# Patient Record
Sex: Male | Born: 1935 | Race: White | Hispanic: No | Smoking: Never smoker
Health system: Southern US, Community
[De-identification: ages and names within clinical notes are randomized; demographics above are authoritative.]

## PROBLEM LIST (undated history)

## (undated) DIAGNOSIS — I499 Cardiac arrhythmia, unspecified: Secondary | ICD-10-CM

## (undated) DIAGNOSIS — E079 Disorder of thyroid, unspecified: Secondary | ICD-10-CM

## (undated) HISTORY — PX: MELANOMA EXCISION: SHX5266

---

## 2011-08-08 ENCOUNTER — Encounter (HOSPITAL_BASED_OUTPATIENT_CLINIC_OR_DEPARTMENT_OTHER): Payer: Self-pay

## 2011-08-08 ENCOUNTER — Emergency Department (HOSPITAL_BASED_OUTPATIENT_CLINIC_OR_DEPARTMENT_OTHER)
Admission: EM | Admit: 2011-08-08 | Discharge: 2011-08-08 | Disposition: A | Discharge status: Home / Self Care | Payer: Medicare Other | Attending: Emergency Medicine | Admitting: Emergency Medicine

## 2011-08-08 ENCOUNTER — Emergency Department (INDEPENDENT_AMBULATORY_CARE_PROVIDER_SITE_OTHER): Payer: Medicare Other

## 2011-08-08 DIAGNOSIS — M25529 Pain in unspecified elbow: Secondary | ICD-10-CM | POA: Insufficient documentation

## 2011-08-08 DIAGNOSIS — S46909A Unspecified injury of unspecified muscle, fascia and tendon at shoulder and upper arm level, unspecified arm, initial encounter: Secondary | ICD-10-CM | POA: Insufficient documentation

## 2011-08-08 DIAGNOSIS — M25539 Pain in unspecified wrist: Secondary | ICD-10-CM | POA: Insufficient documentation

## 2011-08-08 DIAGNOSIS — M25519 Pain in unspecified shoulder: Secondary | ICD-10-CM | POA: Insufficient documentation

## 2011-08-08 DIAGNOSIS — S4990XA Unspecified injury of shoulder and upper arm, unspecified arm, initial encounter: Secondary | ICD-10-CM

## 2011-08-08 DIAGNOSIS — W19XXXA Unspecified fall, initial encounter: Secondary | ICD-10-CM

## 2011-08-08 DIAGNOSIS — Z79899 Other long term (current) drug therapy: Secondary | ICD-10-CM | POA: Insufficient documentation

## 2011-08-08 DIAGNOSIS — S4980XA Other specified injuries of shoulder and upper arm, unspecified arm, initial encounter: Secondary | ICD-10-CM | POA: Insufficient documentation

## 2011-08-08 DIAGNOSIS — W010XXA Fall on same level from slipping, tripping and stumbling without subsequent striking against object, initial encounter: Secondary | ICD-10-CM | POA: Insufficient documentation

## 2011-08-08 DIAGNOSIS — IMO0002 Reserved for concepts with insufficient information to code with codable children: Secondary | ICD-10-CM | POA: Insufficient documentation

## 2011-08-08 MED ORDER — LIDOCAINE-EPINEPHRINE-TETRACAINE (LET) SOLUTION
NASAL | Status: AC
  Administered 2011-08-08: 3 mL
  Filled 2011-08-08: qty 3

## 2011-08-08 MED ORDER — HYDROCODONE-ACETAMINOPHEN 5-325 MG PO TABS
ORAL_TABLET | ORAL | Status: AC
  Administered 2011-08-08: 1
  Filled 2011-08-08: qty 1

## 2011-08-08 MED ORDER — LIDOCAINE HCL 2 % IJ SOLN
INTRAMUSCULAR | Status: AC
  Filled 2011-08-08: qty 1

## 2011-08-08 MED ORDER — HYDROCODONE-ACETAMINOPHEN 5-325 MG PO TABS
1.0000 | ORAL_TABLET | ORAL | Status: AC | PRN
Start: 2011-08-08 — End: 2011-08-18

## 2011-08-08 MED ORDER — ACETAMINOPHEN 325 MG PO TABS
650.0000 mg | ORAL_TABLET | Freq: Once | ORAL | Status: AC
  Administered 2011-08-08: 650 mg via ORAL
  Filled 2011-08-08: qty 2

## 2011-08-08 NOTE — ED Notes (Signed)
LET applied to left elbow wound

## 2011-08-08 NOTE — ED Provider Notes (Signed)
Medical screening examination/treatment/procedure(s) were conducted as a shared visit with non-physician practitioner(s) and myself.  I personally evaluated the patient during the encounter  Pt with fall, plain films shows no fracture.  Skin avulsion without suturable wound to palm, not bleeding, and skin tear to left elbow, but FROM of elbow, wrist, hand.  Will tack skin back and place steri strips.    VS stable.  ROM of shoulder is limited, but present.  Likely contused and sprained.    Gavin Pound. Davis Vannatter, MD 08/08/11 1652

## 2011-08-08 NOTE — ED Notes (Signed)
Pt fell today on his carport and landed on concrete.  C/o L shoulder pain, (ROM decr d/t pain), abrasions to L wrist and L elbow.  Bleeding controlled by bandages applied at home.

## 2011-08-08 NOTE — ED Provider Notes (Signed)
History     CSN: [id number]  Arrival date & time 08/08/11  1457   First MD Initiated Contact with Patient 08/08/11 1533      Chief Complaint  Patient presents with  . Fall    (Consider location/radiation/quality/duration/timing/severity/associated sxs/prior treatment) HPI  Pt was walking in [place] when he accidentally tripped and fell. He did not hit his head, injure his neck, or have LOC. He is complaining of left shoulder pain, left elbow pain and left wrist pain. Pt denies being on blood thinners. He has a skin hear on his elbow and wrist. Bleeding is controlled at this time.  History reviewed. No pertinent past medical history.  Past Surgical History  Procedure Date  . Melanoma excision     History reviewed. No pertinent family history.  History  Substance Use Topics  . Smoking status: Never Smoker   . Smokeless tobacco: Never Used  . Alcohol Use: No      Review of Systems  All other systems reviewed and are negative.    Allergies  Review of patient's allergies indicates no known allergies.  Home Medications   Current Outpatient Rx  Name Route Sig Dispense Refill  . LEVOTHYROXINE SODIUM 50 MCG PO TABS Oral Take 50 mcg by mouth daily.    Marland Kitchen METOPROLOL TARTRATE 25 MG PO TABS Oral Take 25 mg by mouth daily.    Marland Kitchen NAPROXEN SODIUM 220 MG PO TABS Oral Take 220 mg by mouth 2 (two) times daily with a meal. Patient used this medication for the injury to his elbow.      Pulse 69  Temp(Src) 98 F (36.7 C) (Oral)  Ht 6\' 1"  (1.854 m)  Wt 186 lb (84.369 kg)  BMI 24.54 kg/m2  SpO2 96%  Physical Exam  Nursing note and vitals reviewed. Constitutional: He appears well-developed and well-nourished. No distress.  HENT:  Head: Normocephalic and atraumatic. Head is without Battle's sign, without abrasion, without contusion and without laceration.  Eyes: Pupils are equal, round, and reactive to light.  Neck: Normal range of motion. Neck supple.  Cardiovascular: Normal  rate and regular rhythm.   Pulmonary/Chest: Effort normal.  Abdominal: Soft.  Musculoskeletal:       Left shoulder: He exhibits decreased range of motion (due to pain), tenderness (mild tenderness to palpation) and pain. He exhibits no bony tenderness, no swelling, no effusion, no crepitus, no deformity, no laceration, no spasm, normal pulse and normal strength.       Left elbow: He exhibits normal range of motion, no swelling, no effusion and no deformity. Lacerations: abrasion.       Left wrist: He exhibits normal range of motion, no tenderness, no bony tenderness, no swelling, no effusion, no crepitus and no deformity. Lacerations: abrasion.       Cervical back: Normal.       Arms: Neurological: He is alert.  Skin: Skin is warm and dry.    ED Course  Procedures (including critical care time)  Labs Reviewed - No data to display Dg Shoulder Left  08/08/2011  *RADIOLOGY REPORT*  Clinical Data: Larey Seat with pain in the left shoulder  LEFT SHOULDER - 2+ VIEW  Comparison: None.  Findings: There is a downward sloping acromion with acromial spurring compromising the subacromial space most likely representing impingement and possible chronic rotator cuff disease. There is also degenerative change at the left [facility] joint.  However, no acute fracture or dislocation is seen.  IMPRESSION: Degenerative change most consistent with impingement or chronic rotator  cuff disease.  No acute abnormality.  Original Report Authenticated By: Juline Patch, M.D.     1. Fall   2. Shoulder injury       MDM    .wounds cleaned with betadine, steristripped and covered in tagederm. Wound care instructions given. Dr. Oletta Lamas has seen and evaluated patient and agrees with my treatment and plan.  Pt is ready to be DC'd at this time.  Pt has been advised of the symptoms that warrant their return to the ED. Patient has voiced understanding and has agreed to follow-up with the PCP or  specialist.             Dorthula Matas, PA 08/08/11 1646

## 2012-08-11 ENCOUNTER — Encounter (HOSPITAL_BASED_OUTPATIENT_CLINIC_OR_DEPARTMENT_OTHER): Payer: Self-pay

## 2012-08-11 ENCOUNTER — Emergency Department (HOSPITAL_BASED_OUTPATIENT_CLINIC_OR_DEPARTMENT_OTHER)
Admission: EM | Admit: 2012-08-11 | Discharge: 2012-08-11 | Disposition: A | Discharge status: Home / Self Care | Payer: Medicare Other | Attending: Emergency Medicine | Admitting: Emergency Medicine

## 2012-08-11 DIAGNOSIS — W2203XA Walked into furniture, initial encounter: Secondary | ICD-10-CM | POA: Insufficient documentation

## 2012-08-11 DIAGNOSIS — Z79899 Other long term (current) drug therapy: Secondary | ICD-10-CM | POA: Insufficient documentation

## 2012-08-11 DIAGNOSIS — Y9389 Activity, other specified: Secondary | ICD-10-CM | POA: Insufficient documentation

## 2012-08-11 DIAGNOSIS — Y929 Unspecified place or not applicable: Secondary | ICD-10-CM | POA: Insufficient documentation

## 2012-08-11 DIAGNOSIS — S51009A Unspecified open wound of unspecified elbow, initial encounter: Secondary | ICD-10-CM | POA: Insufficient documentation

## 2012-08-11 DIAGNOSIS — S51012A Laceration without foreign body of left elbow, initial encounter: Secondary | ICD-10-CM

## 2012-08-11 NOTE — ED Provider Notes (Signed)
History    This chart was scribed for Charles B. Bernette Mayers, MD scribed by Magnus Sinning. The patient was seen in room MH04/MH04 at 15:42   CSN: [id number]  Arrival date & time 08/11/12  1421      Chief Complaint  Patient presents with  . Elbow Injury    (Consider location/radiation/quality/duration/timing/severity/associated sxs/prior treatment) The history is provided by the patient. No language interpreter was used.   Manuel Rojas is a 79 y.o. male who presents to the Emergency Department complaining of a 3 cm skin tear to the left lateral elbow as a result of a injury from hitting his elbow along a door frame this afternoon. He denies any head injury,or any sensation of pain to the bone, and reports his tetanus is UTD.   History reviewed. No pertinent past medical history.  Past Surgical History  Procedure Laterality Date  . Melanoma excision      History reviewed. No pertinent family history.  History  Substance Use Topics  . Smoking status: Never Smoker   . Smokeless tobacco: Never Used  . Alcohol Use: No     10 Systems reviewed and are negative for acute change except as noted in the HPI. Review of Systems  Skin:       Laceration to left elbow    Allergies  Review of patient's allergies indicates no known allergies.  Home Medications   Current Outpatient Rx  Name  Route  Sig  Dispense  Refill  . levothyroxine (SYNTHROID, LEVOTHROID) 50 MCG tablet   Oral   Take 50 mcg by mouth daily.         . metoprolol tartrate (LOPRESSOR) 25 MG tablet   Oral   Take 25 mg by mouth daily.         . naproxen sodium (ANAPROX) 220 MG tablet   Oral   Take 220 mg by mouth 2 (two) times daily with a meal. Patient used this medication for the injury to his elbow.           BP 151/91  Pulse 78  Temp(Src) 98.5 F (36.9 C) (Oral)  Resp 20  Ht 6\' 2"  (1.88 m)  Wt 185 lb (83.915 kg)  BMI 23.74 kg/m2  SpO2 95%  Physical Exam  Nursing note and vitals  reviewed. Constitutional: He is oriented to person, place, and time. He appears well-developed and well-nourished.  HENT:  Head: Normocephalic and atraumatic.  Eyes: EOM are normal. Pupils are equal, round, and reactive to light.  Neck: Normal range of motion. Neck supple.  Cardiovascular: Normal rate, normal heart sounds and intact distal pulses.   Pulmonary/Chest: Effort normal and breath sounds normal.  Abdominal: Bowel sounds are normal. He exhibits no distension. There is no tenderness.  Musculoskeletal: Normal range of motion. He exhibits no edema and no tenderness.  Neurological: He is alert and oriented to person, place, and time. He has normal strength. No cranial nerve deficit or sensory deficit.  Skin: Skin is warm and dry. No rash noted.  3 cm skin tear to the left lateral elbow    Psychiatric: He has a normal mood and affect. His behavior is normal.    ED Course  Procedures (including critical care time) DIAGNOSTIC STUDIES: Oxygen Saturation is 95% on room air, adequate by my interpretation.    COORDINATION OF CARE: 15:43: Physical exam performed. Labs Reviewed - No data to display No results found.   1. Skin tear of elbow without complication, left, initial encounter  MDM  Pt with skin tear on the elbow, no concern for underlying bony injury. Skin tear dressed with steri-strips by EMT. Tetanus is UTD.   I personally performed the services described in this documentation, which was scribed in my presence. The recorded information has been reviewed and is accurate.           Charles B. Bernette Mayers, MD 08/11/12 [phone]

## 2012-08-11 NOTE — ED Notes (Signed)
Pt states that he hit his elbow on a door frame.  Skin tear to L elbow, presents for wound care.

## 2013-08-08 ENCOUNTER — Emergency Department (HOSPITAL_BASED_OUTPATIENT_CLINIC_OR_DEPARTMENT_OTHER)
Admission: EM | Admit: 2013-08-08 | Discharge: 2013-08-08 | Disposition: A | Discharge status: Other Acute Inpatient Hospital | Payer: Medicare Other | Attending: Emergency Medicine | Admitting: Emergency Medicine

## 2013-08-08 ENCOUNTER — Encounter (HOSPITAL_BASED_OUTPATIENT_CLINIC_OR_DEPARTMENT_OTHER): Payer: Self-pay | Admitting: Emergency Medicine

## 2013-08-08 ENCOUNTER — Emergency Department (HOSPITAL_BASED_OUTPATIENT_CLINIC_OR_DEPARTMENT_OTHER): Payer: Medicare Other

## 2013-08-08 DIAGNOSIS — Z8679 Personal history of other diseases of the circulatory system: Secondary | ICD-10-CM | POA: Insufficient documentation

## 2013-08-08 DIAGNOSIS — E079 Disorder of thyroid, unspecified: Secondary | ICD-10-CM | POA: Insufficient documentation

## 2013-08-08 DIAGNOSIS — M25549 Pain in joints of unspecified hand: Secondary | ICD-10-CM | POA: Insufficient documentation

## 2013-08-08 DIAGNOSIS — L0390 Cellulitis, unspecified: Secondary | ICD-10-CM

## 2013-08-08 DIAGNOSIS — Z791 Long term (current) use of non-steroidal anti-inflammatories (NSAID): Secondary | ICD-10-CM | POA: Insufficient documentation

## 2013-08-08 DIAGNOSIS — IMO0002 Reserved for concepts with insufficient information to code with codable children: Secondary | ICD-10-CM | POA: Insufficient documentation

## 2013-08-08 DIAGNOSIS — Z79899 Other long term (current) drug therapy: Secondary | ICD-10-CM | POA: Insufficient documentation

## 2013-08-08 HISTORY — DX: Disorder of thyroid, unspecified: E07.9

## 2013-08-08 HISTORY — DX: Cardiac arrhythmia, unspecified: I49.9

## 2013-08-08 LAB — CBC WITH DIFFERENTIAL/PLATELET
BASOS PCT: 0 % (ref 0–1)
Basophils Absolute: 0 10*3/uL (ref 0.0–0.1)
Eosinophils Absolute: 0.1 10*3/uL (ref 0.0–0.7)
Eosinophils Relative: 1 % (ref 0–5)
HEMATOCRIT: 39.2 % (ref 39.0–52.0)
HEMOGLOBIN: 13.4 g/dL (ref 13.0–17.0)
LYMPHS PCT: 21 % (ref 12–46)
Lymphs Abs: 1.6 10*3/uL (ref 0.7–4.0)
MCH: 29.9 pg (ref 26.0–34.0)
MCHC: 34.2 g/dL (ref 30.0–36.0)
MCV: 87.5 fL (ref 78.0–100.0)
MONO ABS: 1 10*3/uL (ref 0.1–1.0)
MONOS PCT: 13 % — AB (ref 3–12)
NEUTROS ABS: 4.8 10*3/uL (ref 1.7–7.7)
NEUTROS PCT: 64 % (ref 43–77)
Platelets: 190 10*3/uL (ref 150–400)
RBC: 4.48 MIL/uL (ref 4.22–5.81)
RDW: 13.2 % (ref 11.5–15.5)
WBC: 7.6 10*3/uL (ref 4.0–10.5)

## 2013-08-08 LAB — BASIC METABOLIC PANEL
BUN: 17 mg/dL (ref 6–23)
CHLORIDE: 105 meq/L (ref 96–112)
CO2: 25 mEq/L (ref 19–32)
Calcium: 8.9 mg/dL (ref 8.4–10.5)
Creatinine, Ser: 1 mg/dL (ref 0.50–1.35)
GFR, EST AFRICAN AMERICAN: 76 mL/min — AB (ref >90)
GFR, EST NON AFRICAN AMERICAN: 65 mL/min — AB (ref >90)
Glucose, Bld: 96 mg/dL (ref 70–99)
POTASSIUM: 4.2 meq/L (ref 3.7–5.3)
SODIUM: 141 meq/L (ref 137–147)

## 2013-08-08 MED ORDER — CLINDAMYCIN PHOSPHATE 600 MG/50ML IV SOLN
600.0000 mg | Freq: Once | INTRAVENOUS | Status: AC
  Administered 2013-08-08: 600 mg via INTRAVENOUS
  Filled 2013-08-08: qty 50

## 2013-08-08 NOTE — ED Provider Notes (Signed)
CSN: [id number]     Arrival date & time 08/08/13  1805 History  This chart was scribed for Rolan Bucco, MD by Smiley Houseman, ED Scribe. The patient was seen in room MH09/MH09. Patient's care was started at 6:35 PM.  Chief Complaint  Patient presents with  . Arm Swelling   The history is provided by the patient. No language interpreter was used.   HPI Comments: Manuel Rojas is a 80 y.o. male who presents to the Emergency Department complaining of a left hand injury that occurred yesterday when the pt fell.  Pt reports he was evaluated at an Urgent Care in [facility] yesterday.  He states they applied skin glue and completed a x-ray.  Pt states the x-ray showed normal results.  He states the physician there prescribed him antibiotics that he had filled.  Pt states the difference today is the increase in swelling to his entire left hand, especially in his thumb.  Pt denies pain with movement, but is tender to palpation.  Pt reports his tetanus is UTD.    Past Medical History  Diagnosis Date  . Thyroid disease   . Irregular heart beat    Past Surgical History  Procedure Laterality Date  . Melanoma excision     No family history on file. History  Substance Use Topics  . Smoking status: Never Smoker   . Smokeless tobacco: Never Used  . Alcohol Use: No    Review of Systems  Constitutional: Negative for fever and chills.  Gastrointestinal: Negative for nausea, vomiting, abdominal pain and diarrhea.  Musculoskeletal: Positive for arthralgias (left hand) and joint swelling (left hand).  Skin: Positive for wound (dorsal aspect of left hand). Negative for color change and rash.  Neurological: Negative for headaches.  All other systems reviewed and are negative.   Allergies  Review of patient's allergies indicates no known allergies.  Home Medications   Current Outpatient Rx  Name  Route  Sig  Dispense  Refill  . levothyroxine (SYNTHROID, LEVOTHROID) 50 MCG tablet   Oral   Take 50 mcg by  mouth daily.         . metoprolol tartrate (LOPRESSOR) 25 MG tablet   Oral   Take 25 mg by mouth daily.         . naproxen sodium (ANAPROX) 220 MG tablet   Oral   Take 220 mg by mouth 2 (two) times daily with a meal. Patient used this medication for the injury to his elbow.          Triage Vitals: BP 128/76  Pulse 69  Temp(Src) 98.6 F (37 C) (Oral)  Resp 16  Ht 6\' 2"  (1.88 m)  Wt 185 lb (83.915 kg)  BMI 23.74 kg/m2  SpO2 96%  Physical Exam  Nursing note and vitals reviewed. Constitutional: He is oriented to person, place, and time. He appears well-developed and well-nourished. No distress.  HENT:  Head: Normocephalic and atraumatic.  Eyes: Conjunctivae and EOM are normal. Right eye exhibits no discharge. Left eye exhibits no discharge.  Neck: Neck supple. No tracheal deviation present.  Cardiovascular: Normal rate.   Pulmonary/Chest: Effort normal. No respiratory distress.  Musculoskeletal: Normal range of motion. He exhibits edema and tenderness.  Diffuse swelling over the dorsum of the left hand.  Several large skin tears over the dorsum of the hand and base of thumb.  Tissues involving the skin tears appear gray and devitalized.  There is a seroserous drainage around the skin glue, but no purulent  drainage.  There is tenderness with palpation of the hand, but no pain with ROM.  Neurovascularly intact.  No induration or fluctuation.  No extension of the redness past the wrist.    Neurological: He is alert and oriented to person, place, and time.  Skin: Skin is warm and dry. No rash noted.  Psychiatric: He has a normal mood and affect. His behavior is normal.    ED Course  Procedures (including critical care time) DIAGNOSTIC STUDIES: Oxygen Saturation is 96% on RA, adequate by my interpretation.    COORDINATION OF CARE: 6:45 PM-Will order x-ray of left hand.  Will order intravenous clindamycin.  Will consult to hand surgery.   Patient informed of current plan of  treatment and evaluation and agrees with plan.    Results for orders placed during the hospital encounter of 08/08/13  BASIC METABOLIC PANEL      Result Value Ref Range   Sodium 141  137 - 147 mEq/L   Potassium 4.2  3.7 - 5.3 mEq/L   Chloride 105  96 - 112 mEq/L   CO2 25  19 - 32 mEq/L   Glucose, Bld 96  70 - 99 mg/dL   BUN 17  6 - 23 mg/dL   Creatinine, Ser 4.091.00  0.50 - 1.35 mg/dL   Calcium 8.9  8.4 - 81.110.5 mg/dL   GFR calc non Af Amer 65 (*) >90 mL/min   GFR calc Af Amer 76 (*) >90 mL/min  CBC WITH DIFFERENTIAL      Result Value Ref Range   WBC 7.6  4.0 - 10.5 K/uL   RBC 4.48  4.22 - 5.81 MIL/uL   Hemoglobin 13.4  13.0 - 17.0 g/dL   HCT 91.439.2  78.239.0 - 95.652.0 %   MCV 87.5  78.0 - 100.0 fL   MCH 29.9  26.0 - 34.0 pg   MCHC 34.2  30.0 - 36.0 g/dL   RDW 21.313.2  08.611.5 - 57.815.5 %   Platelets 190  150 - 400 K/uL   Neutrophils Relative % 64  43 - 77 %   Neutro Abs 4.8  1.7 - 7.7 K/uL   Lymphocytes Relative 21  12 - 46 %   Lymphs Abs 1.6  0.7 - 4.0 K/uL   Monocytes Relative 13 (*) 3 - 12 %   Monocytes Absolute 1.0  0.1 - 1.0 K/uL   Eosinophils Relative 1  0 - 5 %   Eosinophils Absolute 0.1  0.0 - 0.7 K/uL   Basophils Relative 0  0 - 1 %   Basophils Absolute 0.0  0.0 - 0.1 K/uL    Imaging Review Dg Hand Complete Left  08/08/2013   CLINICAL DATA:  Swelling secondary to a fall yesterday.  EXAM: LEFT HAND - COMPLETE 3+ VIEW  COMPARISON:  None.  FINDINGS: No acute fracture or dislocation. 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  IMPRESSION: No acute abnormality.  Slight arthritic changes.   Electronically Signed   By: Geanie CooleyJim  Maxwell M.D.   On: 08/08/2013 19:43    MDM   Final diagnoses:  Cellulitis    Patient presents with cellulitis of the hand. I don't feel any underlying abscess. There is no purulent drainage. Since she's been in the ED the redness has extended past the wrist. He's taken 2 doses of Bactrim over the last 24 hours. I did give  them IV clindamycin in ED. I feel that he needs to be admitted for IV  antibiotics. I initially contacted Dr. Merlyn Lot the hand surgeon on call at [facility]. However the patient wants to go to [facility]. I spoke with the hospitalist there, Dr. Johny Drilling who has accepted patient for transfer.     Rolan Bucco, MD 08/08/13 2049

## 2013-08-08 NOTE — ED Notes (Signed)
Pt reports left hand xray was negative yesterday.  Nonadherent dressing removed using saline soaked gauze.  Bleeding controlled.  PMS intact.

## 2013-08-08 NOTE — ED Notes (Signed)
Fell yesterday, injuring left hand.  [facility] a [facility] yesterday and now has increased swelling in hand.

## 2013-10-21 ENCOUNTER — Encounter (HOSPITAL_BASED_OUTPATIENT_CLINIC_OR_DEPARTMENT_OTHER): Payer: Self-pay | Admitting: Emergency Medicine

## 2013-10-21 ENCOUNTER — Emergency Department (HOSPITAL_BASED_OUTPATIENT_CLINIC_OR_DEPARTMENT_OTHER): Payer: Medicare Other

## 2013-10-21 ENCOUNTER — Emergency Department (HOSPITAL_BASED_OUTPATIENT_CLINIC_OR_DEPARTMENT_OTHER)
Admission: EM | Admit: 2013-10-21 | Discharge: 2013-10-21 | Disposition: A | Discharge status: Home / Self Care | Payer: Medicare Other | Attending: Emergency Medicine | Admitting: Emergency Medicine

## 2013-10-21 DIAGNOSIS — Z79899 Other long term (current) drug therapy: Secondary | ICD-10-CM | POA: Insufficient documentation

## 2013-10-21 DIAGNOSIS — Y9389 Activity, other specified: Secondary | ICD-10-CM | POA: Insufficient documentation

## 2013-10-21 DIAGNOSIS — Z791 Long term (current) use of non-steroidal anti-inflammatories (NSAID): Secondary | ICD-10-CM | POA: Insufficient documentation

## 2013-10-21 DIAGNOSIS — S59919A Unspecified injury of unspecified forearm, initial encounter: Principal | ICD-10-CM

## 2013-10-21 DIAGNOSIS — X58XXXA Exposure to other specified factors, initial encounter: Secondary | ICD-10-CM | POA: Insufficient documentation

## 2013-10-21 DIAGNOSIS — Z8679 Personal history of other diseases of the circulatory system: Secondary | ICD-10-CM | POA: Insufficient documentation

## 2013-10-21 DIAGNOSIS — S59909A Unspecified injury of unspecified elbow, initial encounter: Secondary | ICD-10-CM | POA: Insufficient documentation

## 2013-10-21 DIAGNOSIS — Y9289 Other specified places as the place of occurrence of the external cause: Secondary | ICD-10-CM | POA: Insufficient documentation

## 2013-10-21 DIAGNOSIS — S6990XA Unspecified injury of unspecified wrist, hand and finger(s), initial encounter: Principal | ICD-10-CM

## 2013-10-21 DIAGNOSIS — E079 Disorder of thyroid, unspecified: Secondary | ICD-10-CM | POA: Insufficient documentation

## 2013-10-21 DIAGNOSIS — S6991XA Unspecified injury of right wrist, hand and finger(s), initial encounter: Secondary | ICD-10-CM

## 2013-10-21 NOTE — ED Notes (Signed)
Pt c/o right wrist injury x 8 hrs ago

## 2013-10-21 NOTE — ED Provider Notes (Signed)
CSN: [id number]     Arrival date & time 10/21/13  1952 History   First MD Initiated Contact with Patient 10/21/13 2000     Chief Complaint  Patient presents with  . Wrist Pain     (Consider location/radiation/quality/duration/timing/severity/associated sxs/prior Treatment) HPI Comments: Patient is an 80 year old male who presents with right wrist pain since this morning. Symptoms started when he was lifting himself out of a chair earlier today when his right grip slipped off the arm rest. The pain is aching and moderate without radiation. Movement and palpation makes the pain worse. No other injuries. No alleviating factors.    Past Medical History  Diagnosis Date  . Thyroid disease   . Irregular heart beat    Past Surgical History  Procedure Laterality Date  . Melanoma excision     History reviewed. No pertinent family history. History  Substance Use Topics  . Smoking status: Never Smoker   . Smokeless tobacco: Never Used  . Alcohol Use: No    Review of Systems  Constitutional: Negative for fever, chills and fatigue.  HENT: Negative for trouble swallowing.   Eyes: Negative for visual disturbance.  Respiratory: Negative for shortness of breath.   Cardiovascular: Negative for chest pain and palpitations.  Gastrointestinal: Negative for nausea, vomiting, abdominal pain and diarrhea.  Genitourinary: Negative for dysuria and difficulty urinating.  Musculoskeletal: Positive for arthralgias and joint swelling. Negative for neck pain.  Skin: Negative for color change.  Neurological: Negative for dizziness and weakness.  Psychiatric/Behavioral: Negative for dysphoric mood.      Allergies  Review of patient's allergies indicates no known allergies.  Home Medications   Prior to Admission medications   Medication Sig Start Date End Date Taking? Authorizing Provider  levothyroxine (SYNTHROID, LEVOTHROID) 50 MCG tablet Take 50 mcg by mouth daily.    Historical Provider, MD   metoprolol tartrate (LOPRESSOR) 25 MG tablet Take 25 mg by mouth daily.    Historical Provider, MD  naproxen sodium (ANAPROX) 220 MG tablet Take 220 mg by mouth 2 (two) times daily with a meal. Patient used this medication for the injury to his elbow.    Historical Provider, MD   BP 110/63  Temp(Src) 98.3 F (36.8 C) (Oral)  Resp 16  Ht 6\' 1"  (1.854 m)  Wt 170 lb (77.111 kg)  BMI 22.43 kg/m2  SpO2 100% Physical Exam  Nursing note and vitals reviewed. Constitutional: He is oriented to person, place, and time. He appears well-developed and well-nourished. No distress.  HENT:  Head: Normocephalic and atraumatic.  Eyes: Conjunctivae and EOM are normal.  Neck: Normal range of motion.  Cardiovascular: Normal rate and regular rhythm.  Exam reveals no gallop and no friction rub.   No murmur heard. Pulmonary/Chest: Effort normal and breath sounds normal. He has no wheezes. He has no rales. He exhibits no tenderness.  Musculoskeletal:  Right volar wrist tenderness to palpation. No snuff box tenderness to palpation. No obvious deformity. Slightly limited ROM due to pain.   Neurological: He is alert and oriented to person, place, and time. Coordination normal.  Right grip strength slightly weaker than the left due to pain. Speech is goal-oriented. Moves limbs without ataxia.   Skin: Skin is warm and dry.  Psychiatric: He has a normal mood and affect. His behavior is normal.    ED Course  Procedures (including critical care time) Labs Review Labs Reviewed - No data to display  Imaging Review Dg Wrist Complete Right  10/21/2013  CLINICAL DATA:  80 year old male with wrist pain following injury  EXAM: RIGHT WRIST - COMPLETE 3+ VIEW  COMPARISON:  None.  FINDINGS: There is no evidence of fracture or dislocation. There is no evidence of arthropathy or other focal bone abnormality. Soft tissues are unremarkable.  IMPRESSION: Negative.   Electronically Signed   By: Laveda AbbeJeff  Hu M.D.   On: 10/21/2013  20:30     EKG Interpretation None      MDM   Final diagnoses:  Right wrist injury    8:37 PM Patient's xray unremarkable for acute changes. No neurovascular compromise. Patient has no other complaints or injuries. Vitals stable and patient afebrile.    Emilia BeckKaitlyn Sung [place], PA-C 10/21/13 2046

## 2013-10-21 NOTE — ED Provider Notes (Signed)
Medical screening examination/treatment/procedure(s) were conducted as a shared visit with non-physician practitioner(s) and myself.  I personally evaluated the patient during the encounter.   EKG Interpretation None      Patient presents with wrist pain.  Onset of pain when he tried to push himself up from a chair. No obvious deformity. Neurovascular intact. Plain films negative. Patient to be discharged home.  Shon Batonourtney F Horton, MD 10/21/13 [phone]

## 2013-10-21 NOTE — Discharge Instructions (Signed)
Rest, ice and elevate your right wrist to alleviate pain and swelling. Refer to attached documents for more information. Follow up with your doctor as needed.

## 2014-10-08 ENCOUNTER — Emergency Department (HOSPITAL_BASED_OUTPATIENT_CLINIC_OR_DEPARTMENT_OTHER): Payer: Medicare Other

## 2014-10-08 ENCOUNTER — Emergency Department (HOSPITAL_BASED_OUTPATIENT_CLINIC_OR_DEPARTMENT_OTHER)
Admission: EM | Admit: 2014-10-08 | Discharge: 2014-10-08 | Disposition: A | Discharge status: Home / Self Care | Payer: Medicare Other | Attending: Emergency Medicine | Admitting: Emergency Medicine

## 2014-10-08 ENCOUNTER — Encounter (HOSPITAL_BASED_OUTPATIENT_CLINIC_OR_DEPARTMENT_OTHER): Payer: Self-pay | Admitting: Emergency Medicine

## 2014-10-08 DIAGNOSIS — Z8679 Personal history of other diseases of the circulatory system: Secondary | ICD-10-CM | POA: Insufficient documentation

## 2014-10-08 DIAGNOSIS — Y9389 Activity, other specified: Secondary | ICD-10-CM | POA: Diagnosis not present

## 2014-10-08 DIAGNOSIS — Y998 Other external cause status: Secondary | ICD-10-CM | POA: Diagnosis not present

## 2014-10-08 DIAGNOSIS — Z791 Long term (current) use of non-steroidal anti-inflammatories (NSAID): Secondary | ICD-10-CM | POA: Diagnosis not present

## 2014-10-08 DIAGNOSIS — Z79899 Other long term (current) drug therapy: Secondary | ICD-10-CM | POA: Insufficient documentation

## 2014-10-08 DIAGNOSIS — Y9289 Other specified places as the place of occurrence of the external cause: Secondary | ICD-10-CM | POA: Diagnosis not present

## 2014-10-08 DIAGNOSIS — W01198A Fall on same level from slipping, tripping and stumbling with subsequent striking against other object, initial encounter: Secondary | ICD-10-CM | POA: Insufficient documentation

## 2014-10-08 DIAGNOSIS — S4991XA Unspecified injury of right shoulder and upper arm, initial encounter: Secondary | ICD-10-CM | POA: Insufficient documentation

## 2014-10-08 DIAGNOSIS — E079 Disorder of thyroid, unspecified: Secondary | ICD-10-CM | POA: Insufficient documentation

## 2014-10-08 DIAGNOSIS — W19XXXA Unspecified fall, initial encounter: Secondary | ICD-10-CM

## 2014-10-08 DIAGNOSIS — M25511 Pain in right shoulder: Secondary | ICD-10-CM

## 2014-10-08 NOTE — ED Notes (Signed)
Patient transported to X-ray 

## 2014-10-08 NOTE — Discharge Instructions (Signed)
Fall Prevention and Home Safety °Falls cause injuries and can affect all age groups. It is possible to prevent falls.  °HOW TO PREVENT FALLS °· Wear shoes with rubber soles that do not have an opening for your toes. °· Keep the inside and outside of your house well lit. °· Use night lights throughout your home. °· Remove clutter from floors. °· Clean up floor spills. °· Remove throw rugs or fasten them to the floor with carpet tape. °· Do not place electrical cords across pathways. °· Put grab bars by your tub, shower, and toilet. Do not use towel bars as grab bars. °· Put handrails on both sides of the stairway. Fix loose handrails. °· Do not climb on stools or stepladders, if possible. °· Do not wax your floors. °· Repair uneven or unsafe sidewalks, walkways, or stairs. °· Keep items you use a lot within reach. °· Be aware of pets. °· Keep emergency numbers next to the telephone. °· Put smoke detectors in your home and near bedrooms. °Ask your doctor what other things you can do to prevent falls. °Document Released: 02/19/2009 Document Revised: 10/25/2011 Document Reviewed: 07/26/2011 °[employer]® Patient Information ©2015 [employer]. This information is not intended to replace advice given to you by your health care provider. Make sure you discuss any questions you have with your health care provider. ° °

## 2014-10-08 NOTE — ED Notes (Signed)
Pt states he lost his balance on his raised flower bed and fell. Denies any dizziness. Denies hitting his head

## 2014-10-08 NOTE — ED Provider Notes (Signed)
CSN: [id number]     Arrival date & time 10/08/14  1317 History   First MD Initiated Contact with Patient 10/08/14 1409     Chief Complaint  Patient presents with  . Shoulder Pain     (Consider location/radiation/quality/duration/timing/severity/associated sxs/prior Treatment) HPI Comments: Pt states that he caught his foot on the flower bed and he fell and hit is right shoulder. Denies loc or dizziness associated with fall. No on blood thinner. Took 2 aleve and pain has resolved. No numbness or weakness.  The history is provided by the patient. No language interpreter was used.    Past Medical History  Diagnosis Date  . Thyroid disease   . Irregular heart beat    Past Surgical History  Procedure Laterality Date  . Melanoma excision     History reviewed. No pertinent family history. History  Substance Use Topics  . Smoking status: Never Smoker   . Smokeless tobacco: Never Used  . Alcohol Use: No    Review of Systems  All other systems reviewed and are negative.     Allergies  Review of patient's allergies indicates no known allergies.  Home Medications   Prior to Admission medications   Medication Sig Start Date End Date Taking? Authorizing Provider  levothyroxine (SYNTHROID, LEVOTHROID) 50 MCG tablet Take 50 mcg by mouth daily.    Historical Provider, MD  metoprolol tartrate (LOPRESSOR) 25 MG tablet Take 25 mg by mouth daily.    Historical Provider, MD  naproxen sodium (ANAPROX) 220 MG tablet Take 220 mg by mouth 2 (two) times daily with a meal. Patient used this medication for the injury to his elbow.    Historical Provider, MD   BP 135/59 mmHg  Pulse 65  Temp(Src) 98.2 F (36.8 C) (Oral)  Resp 18  Ht  (1.88 m)  Wt 185 lb (83.915 kg)  BMI 23.74 kg/m2  SpO2 97% Physical Exam  Constitutional: He is oriented to person, place, and time. He appears well-developed and well-nourished.  HENT:  Head: Normocephalic and atraumatic.  Eyes: Conjunctivae and EOM  are normal. Pupils are equal, round, and reactive to light.  Cardiovascular: Normal rate and regular rhythm.   Pulmonary/Chest: Effort normal and breath sounds normal.  Abdominal: Soft. Bowel sounds are normal.  Musculoskeletal: Normal range of motion.       Cervical back: Normal.       Thoracic back: Normal.       Lumbar back: Normal.  Full rom of right shoulder. No deformity or swelling  Neurological: He is alert and oriented to person, place, and time.  Skin: Skin is warm and dry.  Psychiatric: He has a normal mood and affect.  Nursing note and vitals reviewed.   ED Course  Procedures (including critical care time) Labs Review Labs Reviewed - No data to display  Imaging Review Dg Shoulder Right  10/08/2014   CLINICAL DATA:  Right shoulder pain after falling today. Initial encounter.  EXAM: RIGHT SHOULDER - 2+ VIEW  COMPARISON:  None.  FINDINGS: The mineralization and alignment are normal. There is no evidence of acute fracture or dislocation. Mild glenohumeral and acromioclavicular degenerative changes are present. There is mild subacromial spurring with possible mild narrowing of the subacromial space.  IMPRESSION: No acute osseous findings. Degenerative changes as described with possible rotator cuff impingement.   Electronically Signed   By: Carey Bullocks M.D.   On: 10/08/2014 14:13     EKG Interpretation None      MDM  Final diagnoses:  Fall, initial encounter  Right shoulder pain    No acute bony abnormality noted. Pt is feeling better with aleve. Not on blood thinner no sign of head injury. Neurologically intact    Teressa LowerVrinda Taeshaun Rames, NP 10/08/14 1438  Doug SouSam Jacubowitz, MD 10/08/14 [phone]

## 2015-08-20 IMAGING — CR DG HAND COMPLETE 3+V*L*
3 series · 3 of 3 positions shown · non-contrast
Comparison: None.

CLINICAL DATA: Swelling secondary to a fall yesterday.

EXAM:
LEFT HAND - COMPLETE 3+ VIEW

[x hand pa left]
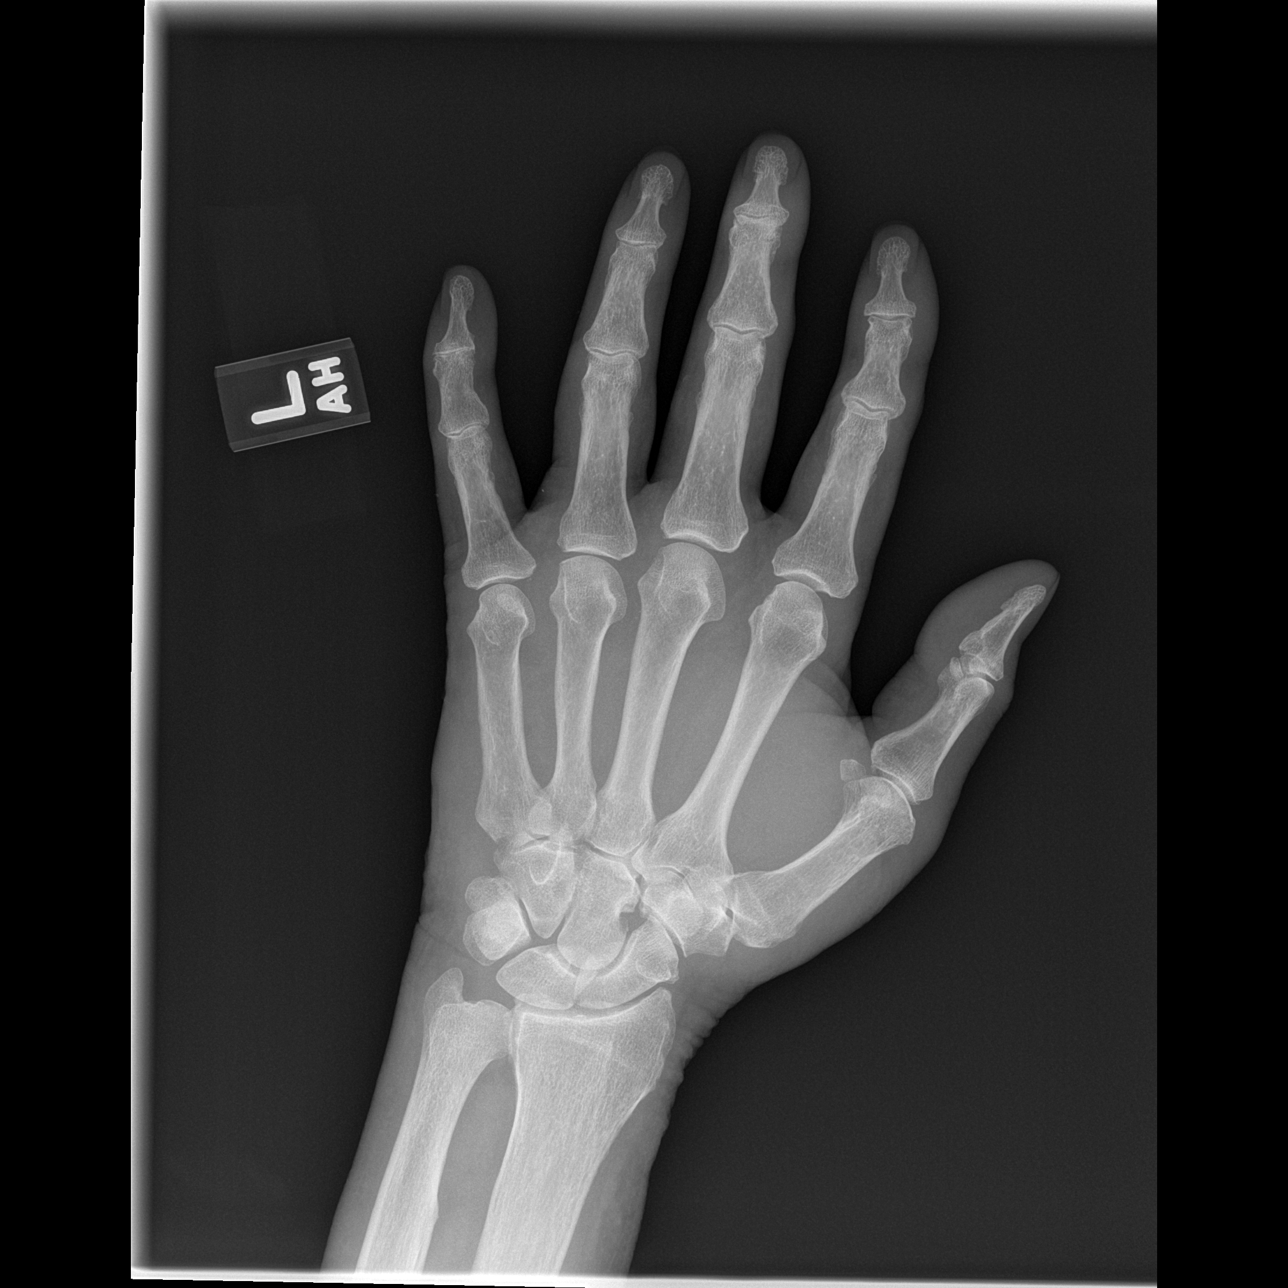

[x hand oblique left]
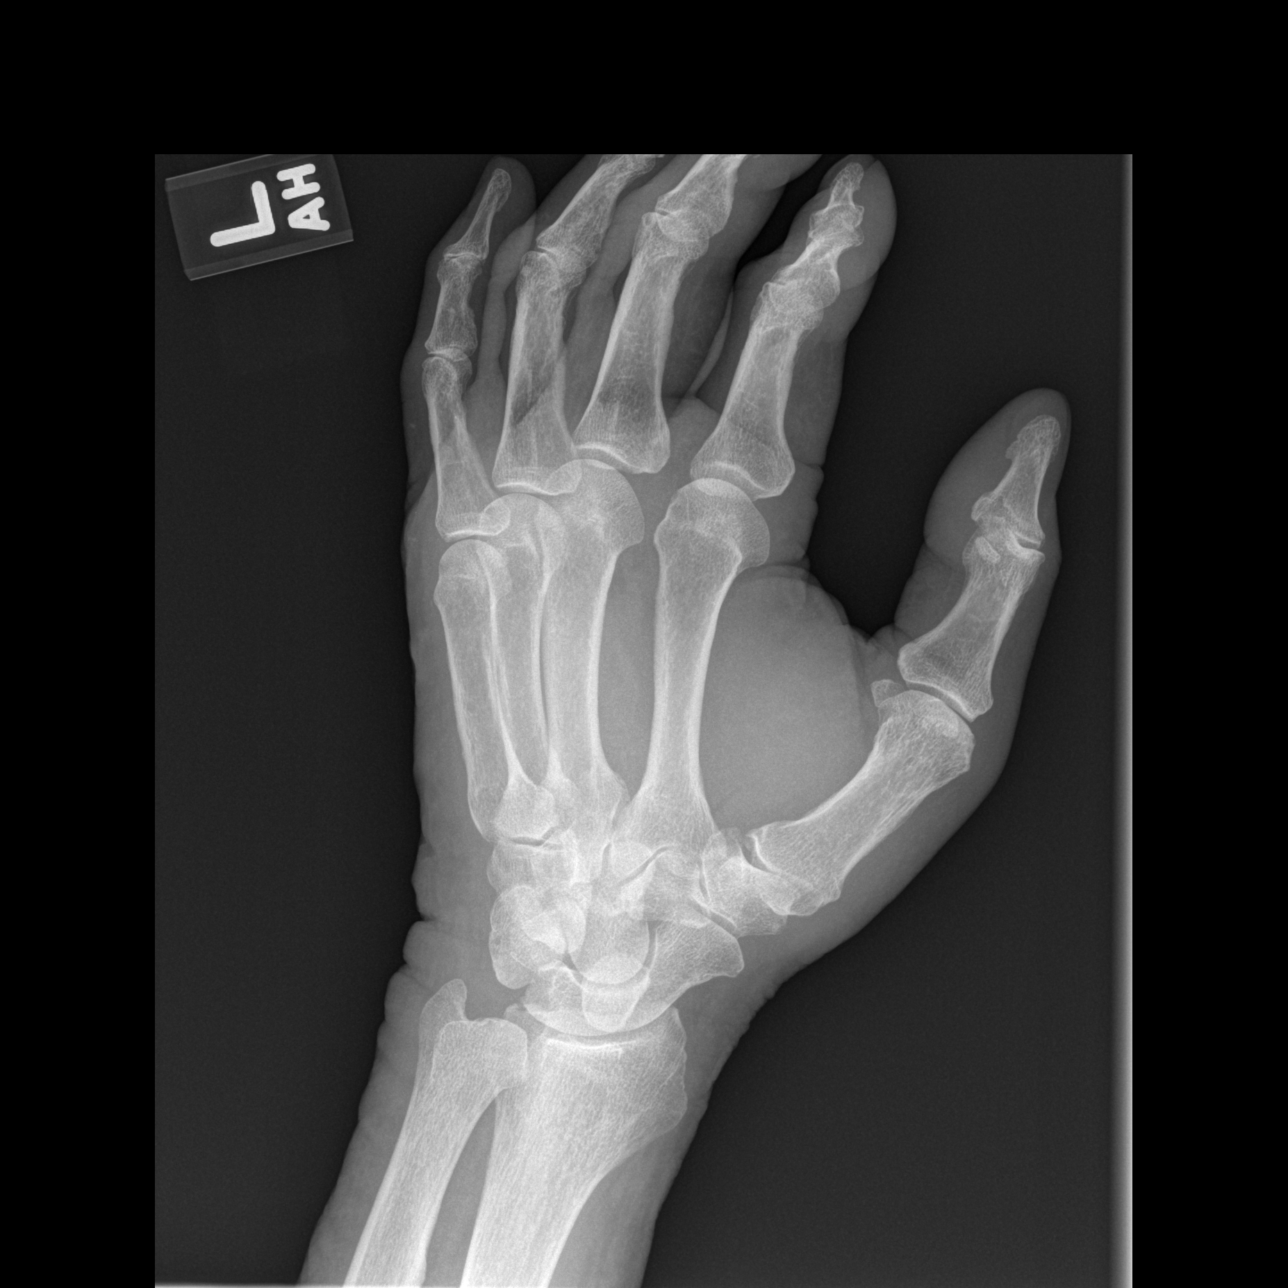

[x hand lat left]
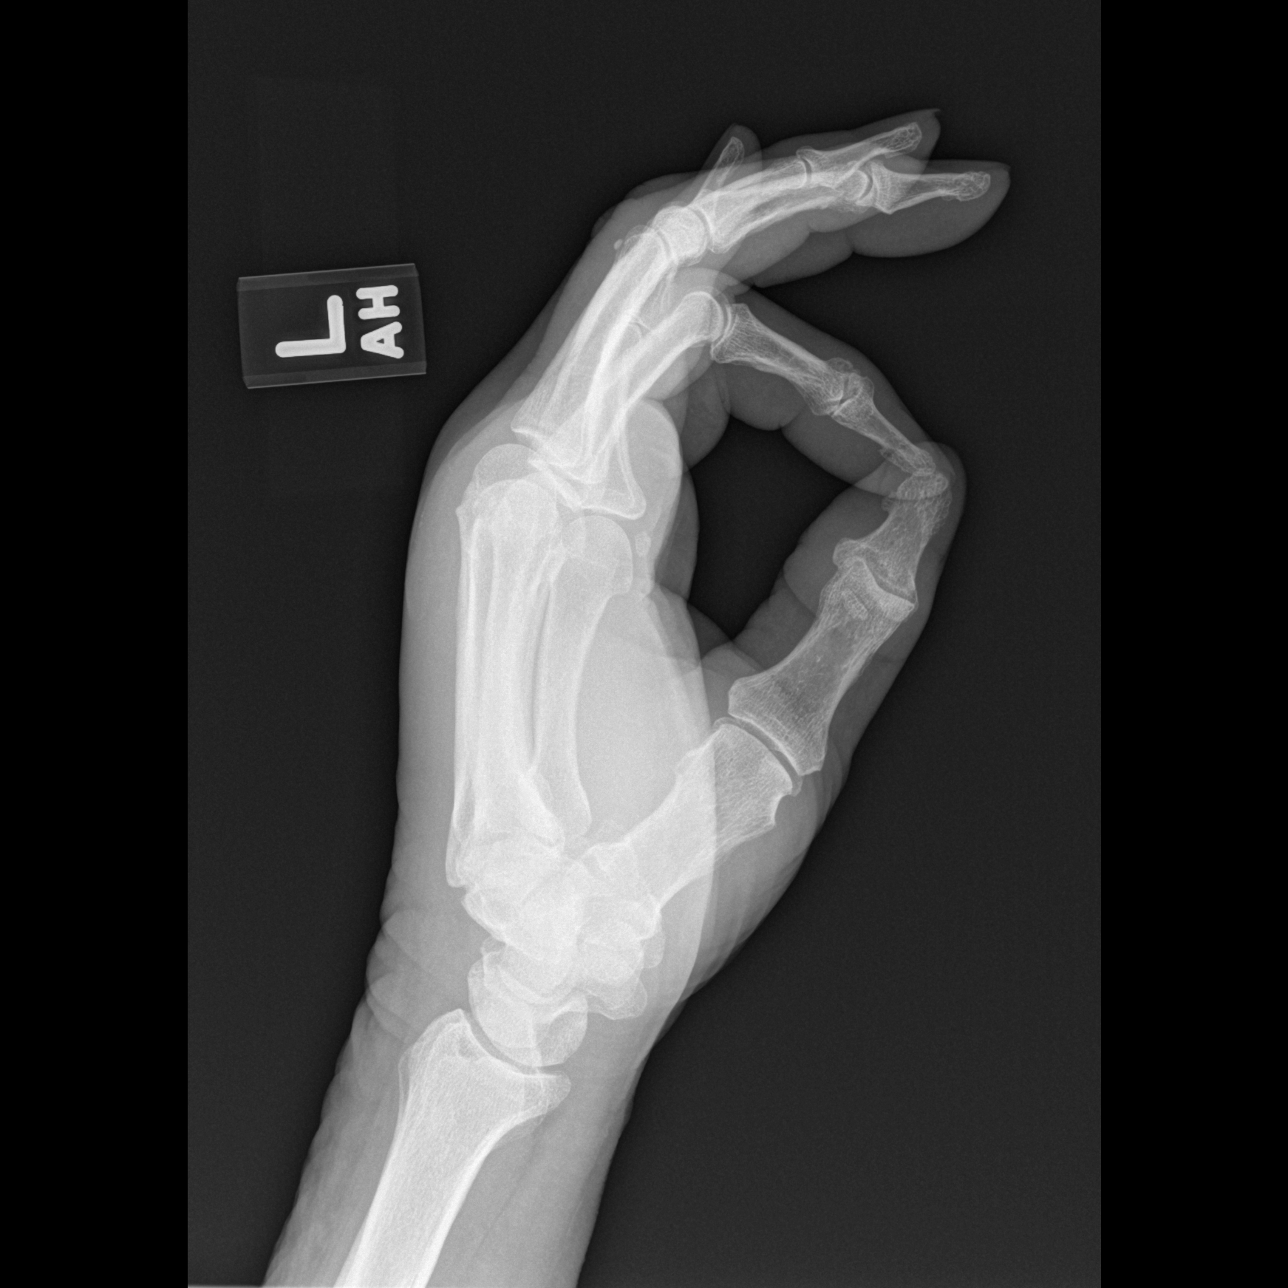

[3 of 3 positions shown; findings below may reference images not displayed]

FINDINGS: No acute fracture or dislocation. Osteoarthritis of the
interphalangeal joints and of the first carpal metacarpal joint.
IMPRESSION: No acute abnormality.  Slight arthritic changes.

## 2015-12-08 DEATH — deceased

## 2016-10-19 IMAGING — CR DG SHOULDER 2+V*R*
3 series · 3 of 3 positions shown · non-contrast
Comparison: None.

CLINICAL DATA: Right shoulder pain after falling today. Initial
encounter.

EXAM:
RIGHT SHOULDER - 2+ VIEW

[w shoulder ap internal righ]
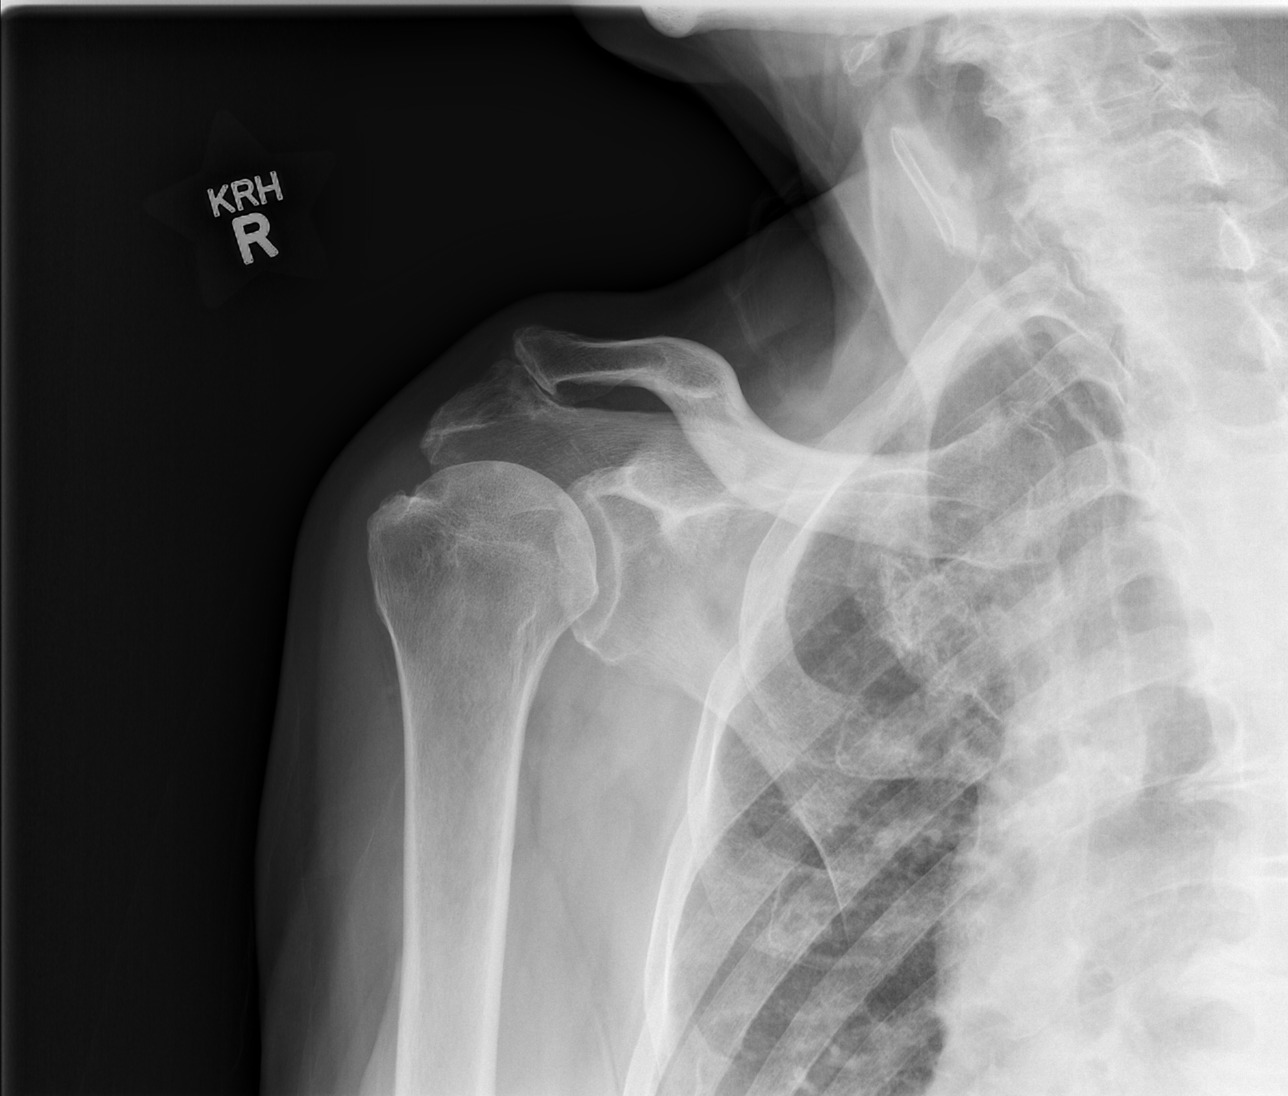

[w shoulder y view right]
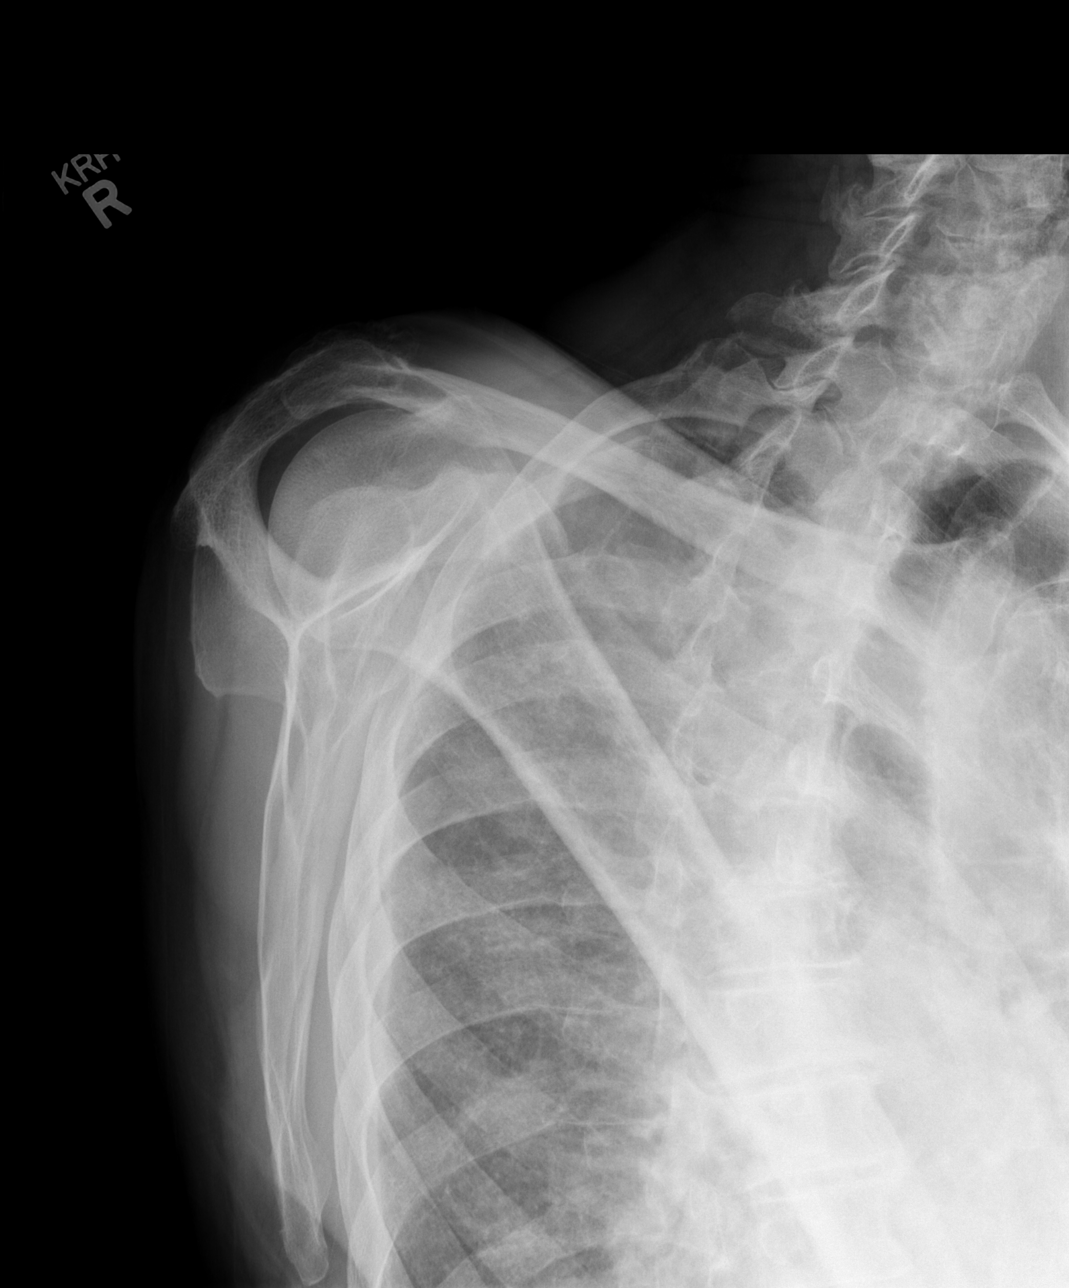

[x shoulder axillary right]
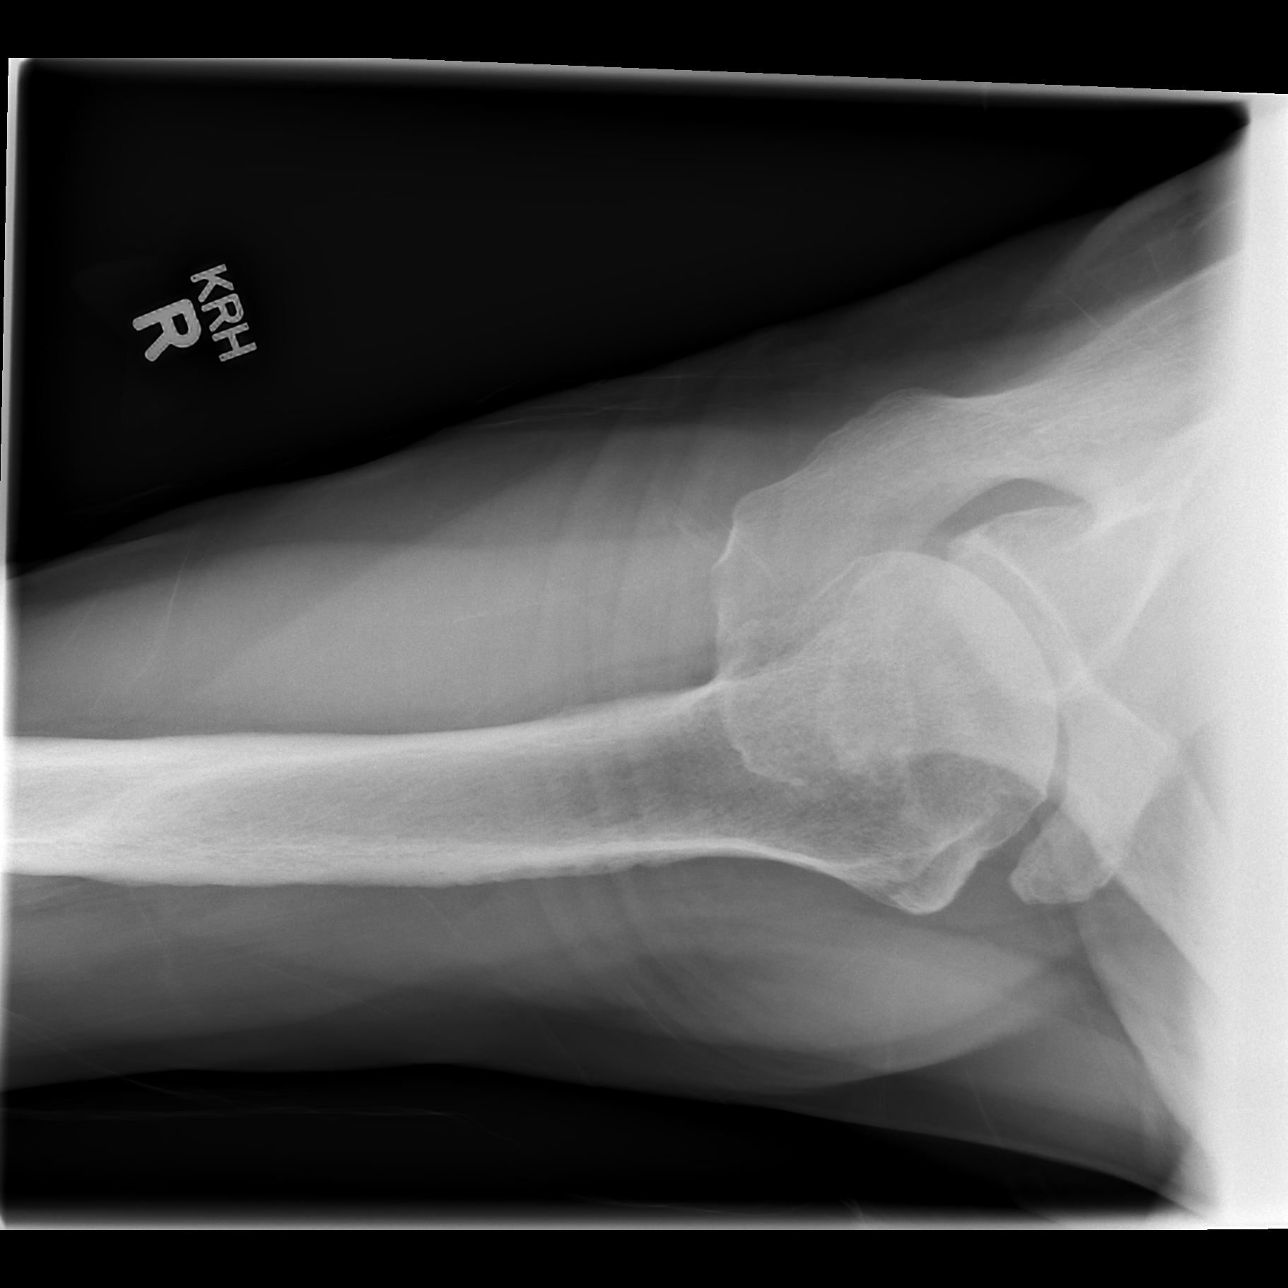

[3 of 3 positions shown; findings below may reference images not displayed]

FINDINGS: The mineralization and alignment are normal. There is no evidence of
acute fracture or dislocation. Mild glenohumeral and
acromioclavicular degenerative changes are present. There is mild
subacromial spurring with possible mild narrowing of the subacromial
space.
IMPRESSION: No acute osseous findings. Degenerative changes as described with
possible rotator cuff impingement.
